# Patient Record
Sex: Male | Born: 1985 | Race: White | Hispanic: No | Marital: Single | Smoking: Current every day smoker
Health system: Southern US, Community
[De-identification: ages and names within clinical notes are randomized; demographics above are authoritative.]

---

## 2008-11-11 ENCOUNTER — Emergency Department: Payer: Self-pay | Admitting: Emergency Medicine

## 2009-05-12 ENCOUNTER — Emergency Department: Payer: Self-pay | Admitting: Emergency Medicine

## 2010-04-04 ENCOUNTER — Emergency Department: Payer: Self-pay | Admitting: Emergency Medicine

## 2011-10-24 ENCOUNTER — Ambulatory Visit: Payer: Self-pay | Admitting: Physician Assistant

## 2013-04-04 IMAGING — CR DG CLAVICLE*L*
1 series · 2 of 2 positions shown · non-contrast
Comparison: none

REASON FOR EXAM: painful
COMMENTS:

PROCEDURE:     MDR - MDR CLAVICLE LEFT  - October 24, 2011  [DATE]
RESULT:     Images of the left clavicle demonstrate no fracture, dislocation
or foreign body.

[Series 1: ap/pa · 0.17mm/px · 2 of 2 slices shown]
[im 1/2]
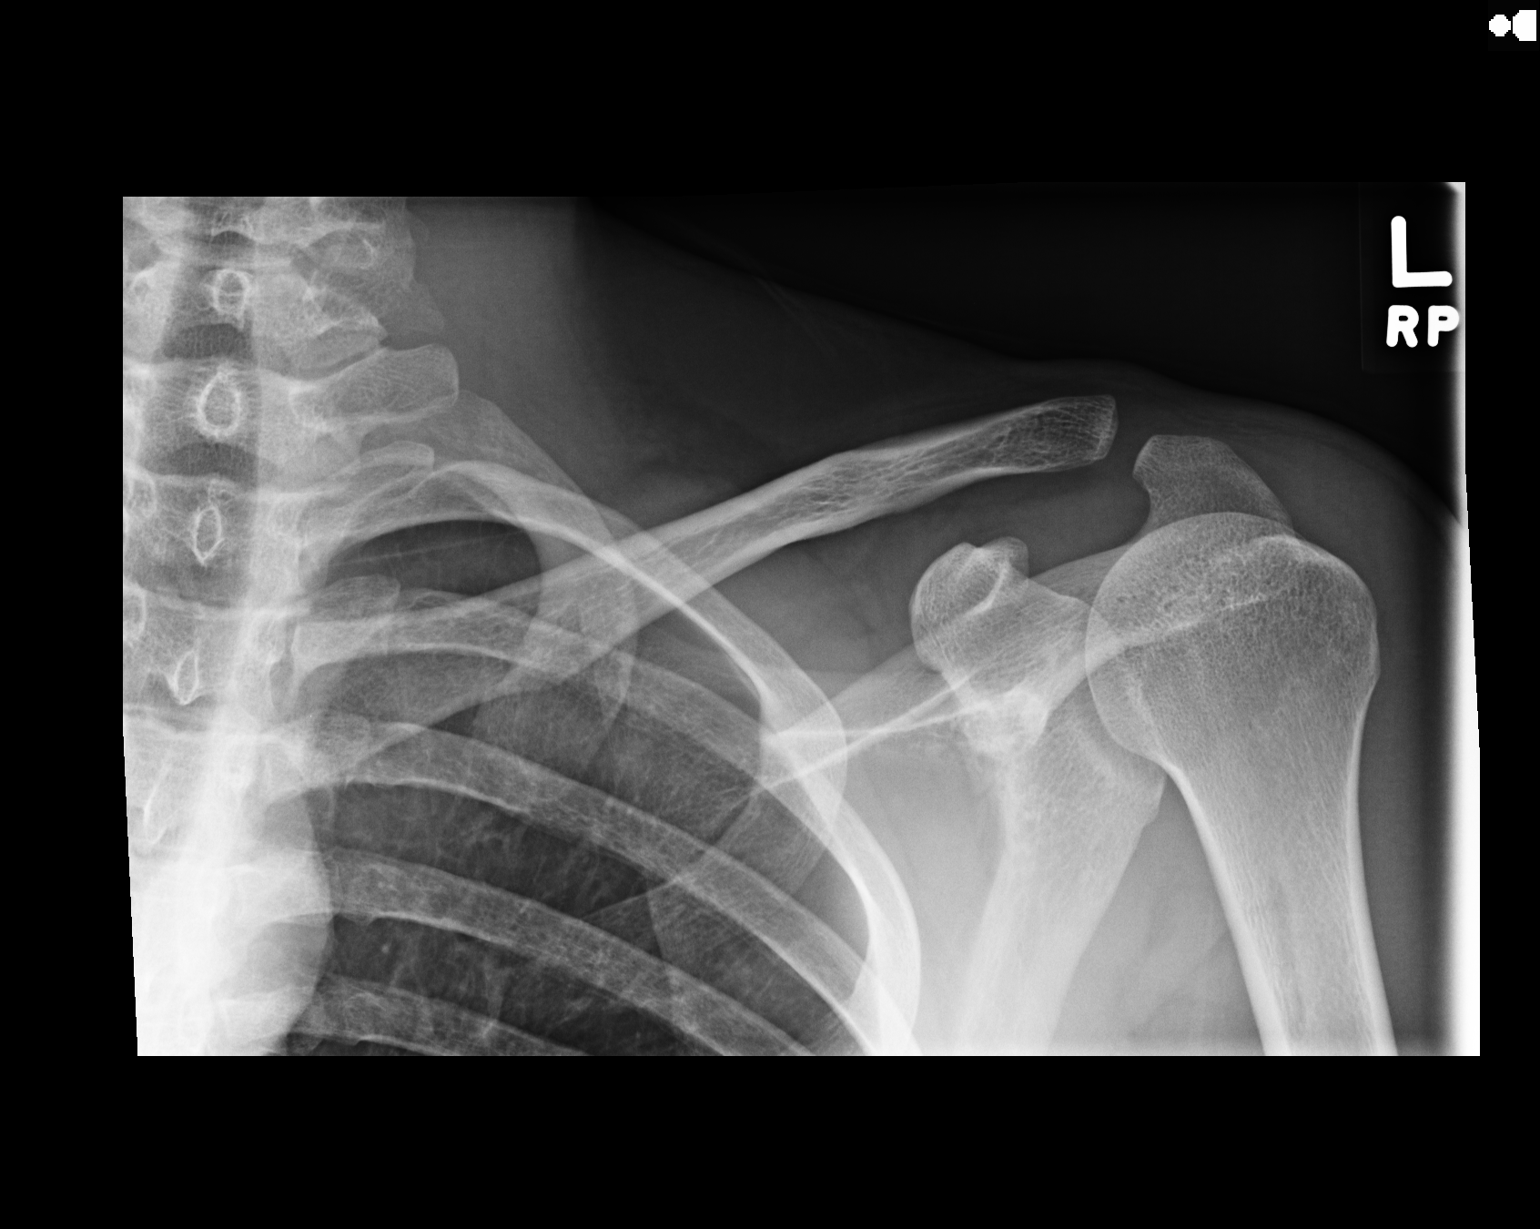
[im 2/2]
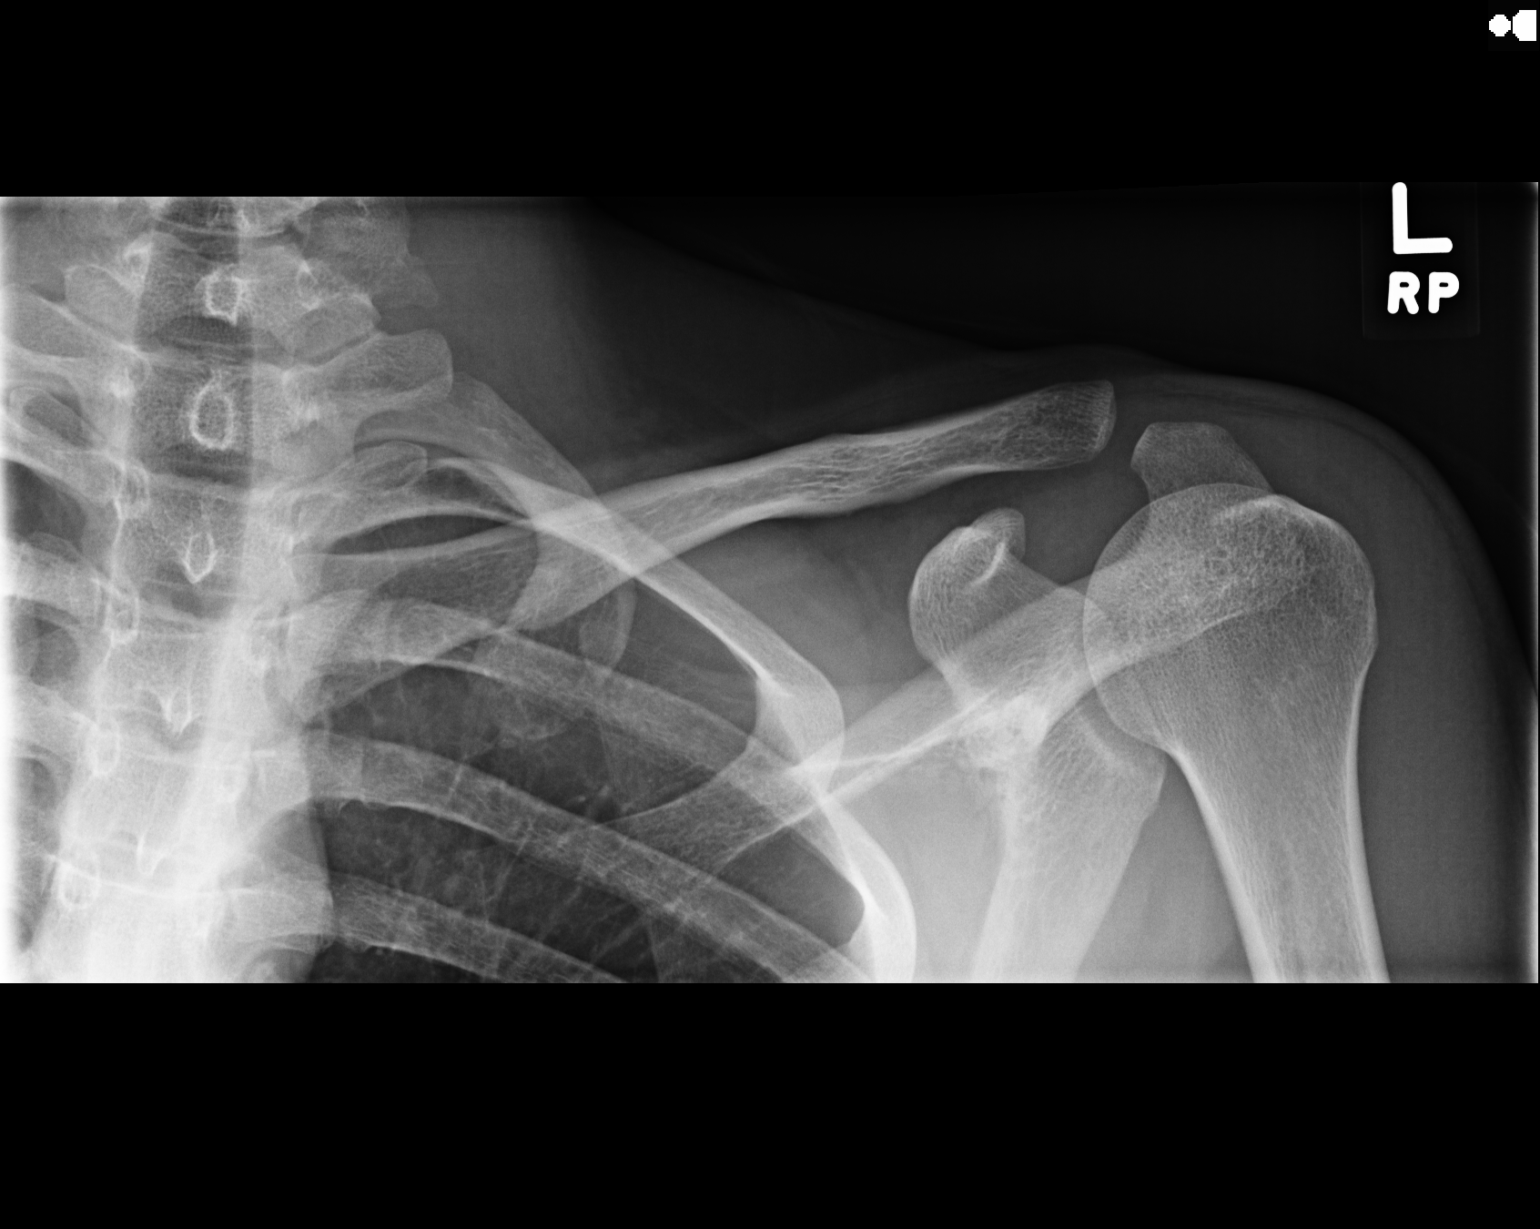

[2 of 2 positions shown; findings below may reference images not displayed]

IMPRESSION: 1. No acute left clavicle bony abnormality evident.

[REDACTED]

## 2014-07-22 ENCOUNTER — Emergency Department
Admit: 2014-07-22 | Disposition: A | Discharge status: Home / Self Care | Payer: Self-pay | Admitting: Emergency Medicine

## 2015-04-05 ENCOUNTER — Encounter: Payer: Self-pay | Admitting: Emergency Medicine

## 2015-04-05 ENCOUNTER — Emergency Department
Admission: EM | Admit: 2015-04-05 | Discharge: 2015-04-05 | Disposition: A | Discharge status: Home / Self Care | Payer: Self-pay | Attending: Emergency Medicine | Admitting: Emergency Medicine

## 2015-04-05 DIAGNOSIS — Y9289 Other specified places as the place of occurrence of the external cause: Secondary | ICD-10-CM | POA: Insufficient documentation

## 2015-04-05 DIAGNOSIS — R000 Tachycardia, unspecified: Secondary | ICD-10-CM | POA: Insufficient documentation

## 2015-04-05 DIAGNOSIS — Y998 Other external cause status: Secondary | ICD-10-CM | POA: Insufficient documentation

## 2015-04-05 DIAGNOSIS — T50901A Poisoning by unspecified drugs, medicaments and biological substances, accidental (unintentional), initial encounter: Secondary | ICD-10-CM

## 2015-04-05 DIAGNOSIS — T407X1A Poisoning by cannabis (derivatives), accidental (unintentional), initial encounter: Secondary | ICD-10-CM | POA: Insufficient documentation

## 2015-04-05 DIAGNOSIS — F17200 Nicotine dependence, unspecified, uncomplicated: Secondary | ICD-10-CM | POA: Insufficient documentation

## 2015-04-05 DIAGNOSIS — Y9389 Activity, other specified: Secondary | ICD-10-CM | POA: Insufficient documentation

## 2015-04-05 DIAGNOSIS — R410 Disorientation, unspecified: Secondary | ICD-10-CM | POA: Insufficient documentation

## 2015-04-05 LAB — SALICYLATE LEVEL: Salicylate Lvl: <4 mg/dL (ref 2.8–30.0)

## 2015-04-05 LAB — COMPREHENSIVE METABOLIC PANEL
ALK PHOS: 59 U/L (ref 38–126)
ALT: 23 U/L (ref 17–63)
AST: 22 U/L (ref 15–41)
Albumin: 4.9 g/dL (ref 3.5–5.0)
Anion gap: 10 (ref 5–15)
BILIRUBIN TOTAL: 0.5 mg/dL (ref 0.3–1.2)
BUN: 19 mg/dL (ref 6–20)
CO2: 25 mmol/L (ref 22–32)
Calcium: 9.5 mg/dL (ref 8.9–10.3)
Chloride: 104 mmol/L (ref 101–111)
Creatinine, Ser: 0.97 mg/dL (ref 0.61–1.24)
GFR calc Af Amer: >60 mL/min (ref >60)
GFR calc non Af Amer: >60 mL/min (ref >60)
GLUCOSE: 156 mg/dL — AB (ref 65–99)
POTASSIUM: 3.2 mmol/L — AB (ref 3.5–5.1)
Sodium: 139 mmol/L (ref 135–145)
TOTAL PROTEIN: 7.7 g/dL (ref 6.5–8.1)

## 2015-04-05 LAB — CBC WITH DIFFERENTIAL/PLATELET
BASOS PCT: 1 %
Basophils Absolute: 0.1 10*3/uL (ref 0–0.1)
EOS ABS: 0.3 10*3/uL (ref 0–0.7)
EOS PCT: 3 %
HCT: 43.8 % (ref 40.0–52.0)
Hemoglobin: 14.4 g/dL (ref 13.0–18.0)
LYMPHS ABS: 4.3 10*3/uL — AB (ref 1.0–3.6)
Lymphocytes Relative: 35 %
MCH: 30.1 pg (ref 26.0–34.0)
MCHC: 32.9 g/dL (ref 32.0–36.0)
MCV: 91.3 fL (ref 80.0–100.0)
MONOS PCT: 8 %
Monocytes Absolute: 0.9 10*3/uL (ref 0.2–1.0)
NEUTROS PCT: 53 %
Neutro Abs: 6.5 10*3/uL (ref 1.4–6.5)
PLATELETS: 269 10*3/uL (ref 150–440)
RBC: 4.79 MIL/uL (ref 4.40–5.90)
RDW: 13.3 % (ref 11.5–14.5)
WBC: 12.1 10*3/uL — AB (ref 3.8–10.6)

## 2015-04-05 LAB — ETHANOL: Alcohol, Ethyl (B): <5 mg/dL (ref <5)

## 2015-04-05 LAB — ACETAMINOPHEN LEVEL: Acetaminophen (Tylenol), Serum: <10 ug/mL — ABNORMAL LOW (ref 10–30)

## 2015-04-05 LAB — TROPONIN I

## 2015-04-05 NOTE — ED Provider Notes (Signed)
[facility] Emergency Department Provider Note  ____________________________________________  Time seen: Approximately 2:36 AM  I have reviewed the triage vital signs and the nursing notes.   HISTORY  Chief Complaint Drug Overdose    HPI Isaac Garza is a 29 y.o. male who reports he smoked a concentrated form of THC, wax her dad. This made him not remember what was going on his. He thought he was having a heart attack his heart was racing like crazy got very confused and called the police. Police brought him in here he was somewhat confused and paranoid with tachycardia. The symptoms resolved rapidly. Patient is not tachycardic anymore is alert oriented patient perfectly good sense his girlfriend is here with him and they want to go home. I told him we have not got any of the tests back yet and could not tell him if there was anything in the material he used except for the TAC. He says he really doesn't care at this point is not given a do that that form of THC again and just wants to go home. He understands that I do not have any reports back.   History reviewed. No pertinent past medical history.  There are no active problems to display for this patient.   History reviewed. No pertinent past surgical history.  No current outpatient prescriptions on file.  Allergies Review of patient's allergies indicates no known allergies.  History reviewed. No pertinent family history.  Social History Social History  Substance Use Topics  . Smoking status: Current Every Day Smoker -- 1.00 packs/day  . Smokeless tobacco: None  . Alcohol Use: No    Review of Systems Constitutional: No fever/chills Eyes: No visual changes. ENT: No sore throat. Cardiovascular: Denies chest pain. Respiratory: Denies shortness of breath. Gastrointestinal: No abdominal pain.  No nausea, no vomiting.  No diarrhea.  No constipation. Genitourinary: Negative for  dysuria. Musculoskeletal: Negative for back pain. Skin: Negative for rash. Neurological: Negative for headaches, focal weakness or numbness.  10-point ROS otherwise negative.  ____________________________________________   PHYSICAL EXAM:  VITAL SIGNS: ED Triage Vitals  Enc Vitals Group     BP 04/05/15 0121 148/85 mmHg     Pulse Rate 04/05/15 0121 114     Resp 04/05/15 0121 18     Temp 04/05/15 0121 98.6 F (37 C)     Temp Source 04/05/15 0121 Oral     SpO2 04/05/15 0121 98 %     Weight 04/05/15 0121 180 lb (81.647 kg)     Height 04/05/15 0121 6' (1.829 m)     Head Cir --      Peak Flow --      Pain Score 04/05/15 0125 0     Pain Loc --      Pain Edu? --      Excl. in GC? --     Constitutional: Initially confused somewhat combative requiring frequent reorientation on discharge he is Alert and oriented. Well appearing and in no acute distress. Eyes: Conjunctivae are normal. PERRL. EOMI. Head: Atraumatic. Nose: No congestion/rhinnorhea. Mouth/Throat: Mucous membranes are moist.  Oropharynx non-erythematous. Neck: No stridor. Neck is supple  Cardiovascular:  initially very tachycardic later Normal rate, regular rhythm. Grossly normal heart sounds.  Good peripheral circulation. Respiratory: Normal respiratory effort.  No retractions. Lungs CTAB. Gastrointestinal: Soft and nontender. No distention. No abdominal bruits. No CVA tenderness. Musculoskeletal: No lower extremity tenderness nor edema.  No joint effusions. Neurologic:   initially confused not making  much sense later Normal speech and language. No gross focal neurologic deficits are appreciated. No gait instability. Skin:  Skin is warm, dry and intact. No rash noted. Psychiatric: Mood and affect are normal. Speech and behavior are normal.  ____________________________________________   LABS (all labs ordered are listed, but only abnormal results are displayed)  Labs Reviewed  COMPREHENSIVE METABOLIC PANEL -  Abnormal; Notable for the following:    Potassium 3.2 (*)    Glucose, Bld 156 (*)    All other components within normal limits  ACETAMINOPHEN LEVEL - Abnormal; Notable for the following:    Acetaminophen (Tylenol), Serum <10 (*)    All other components within normal limits  CBC WITH DIFFERENTIAL/PLATELET - Abnormal; Notable for the following:    WBC 12.1 (*)    Lymphs Abs 4.3 (*)    All other components within normal limits  ETHANOL  SALICYLATE LEVEL  TROPONIN I   ____________________________________________  EKG  EKG read and interpreted by me shows sinus tach at 118 normal axis nonspecific ST-T wave changes or excuse fairly diffusely even the size of the QRS complexes are probably not significant._  RADIOLOGY   ____________________________________________   PROCEDURES   ____________________________________________   INITIAL IMPRESSION / ASSESSMENT AND PLAN / ED COURSE  Pertinent labs & imaging results that were available during my care of the patient were reviewed by me and considered in my medical decision making (see chart for details).   ____________________________________________   FINAL CLINICAL IMPRESSION(S) / ED DIAGNOSES  Final diagnoses:  Accidental drug overdose, initial encounter      Arnaldo Natal, MD 04/05/15 [phone]

## 2015-04-05 NOTE — ED Notes (Signed)
Pt. Girlfriend in room.

## 2015-04-05 NOTE — Discharge Instructions (Signed)
Accidental Overdose °A drug overdose occurs when a chemical substance (drug or medication) is used in amounts large enough to overcome a person. This may result in severe illness or death. This is a type of poisoning. Accidental overdoses of medications or other substances come from a variety of reasons. When this happens accidentally, it is often because the person taking the substance does not know enough about what they have taken. Drugs which commonly cause overdose deaths are alcohol, psychotropic medications (medications which affect the mind), pain medications, illegal drugs (street drugs) such as cocaine and heroin, and multiple drugs taken at the same time. It may result from careless behavior (such as over-indulging at a party). Other causes of overdose may include multiple drug use, a lapse in memory, or drug use after a period of no drug use.  °Sometimes overdosing occurs because a person cannot remember if they have taken their medication.  °A common unintentional overdose in young children involves multi-vitamins containing iron. Iron is a part of the hemoglobin molecule in blood. It is used to transport oxygen to living cells. When taken in small amounts, iron allows the body to restock hemoglobin. In large amounts, it causes problems in the body. If this overdose is not treated, it can lead to death. °Never take medicines that show signs of tampering or do not seem quite right. Never take medicines in the dark or in poor lighting. Read the label and check each dose of medicine before you take it. When adults are poisoned, it happens most often through carelessness or lack of information. Taking medicines in the dark or taking medicine prescribed for someone else to treat the same type of problem is a dangerous practice. °SYMPTOMS  °Symptoms of overdose depend on the medication and amount taken. They can vary from over-activity with stimulant over-dosage, to sleepiness from depressants such as  alcohol, narcotics and tranquilizers. Confusion, dizziness, nausea and vomiting may be present. If problems are severe enough coma and death may result. °DIAGNOSIS  °Diagnosis and management are generally straightforward if the drug is known. Otherwise it is more difficult. At times, certain symptoms and signs exhibited by the patient, or blood tests, can reveal the drug in question.  °TREATMENT  °In an emergency department, most patients can be treated with supportive measures. Antidotes may be available if there has been an overdose of opioids or benzodiazepines. A rapid improvement will often occur if this is the cause of overdose. °At home or away from medical care: °· There may be no immediate problems or warning signs in children. °· Not everything works well in all cases of poisoning. °· Take immediate action. Poisons may act quickly. °· If you think someone has swallowed medicine or a household product, and the person is unconscious, having seizures (convulsions), or is not breathing, immediately call for an ambulance. °IF a person is conscious and appears to be doing OK but has swallowed a poison: °· Do not wait to see what effect the poison will have. Immediately call a poison control center (listed in the white pages of your telephone book under "Poison Control" or inside the front cover with other emergency numbers). Some poison control centers have TTY capability for the deaf. Check with your local center if you or someone in your family requires this service. °· Keep the container so you can read the label on the product for ingredients. °· Describe what, when, and how much was taken and the age and condition of the person poisoned.   Inform them if the person is vomiting, choking, drowsy, shows a change in color or temperature of skin, is conscious or unconscious, or is convulsing.  Do not cause vomiting unless instructed by medical personnel. Do not induce vomiting or force liquids into a person who  is convulsing, unconscious, or very drowsy. Stay calm and in control.   Activated charcoal also is sometimes used in certain types of poisoning and you may wish to add a supply to your emergency medicines. It is available without a prescription. Call a poison control center before using this medication. PREVENTION  Thousands of children die every year from unintentional poisoning. This may be from household chemicals, poisoning from carbon monoxide in a car, taking their parent's medications, or simply taking a few iron pills or vitamins with iron. Poisoning comes from unexpected sources.  Store medicines out of the sight and reach of children, preferably in a locked cabinet. Do not keep medications in a food cabinet. Always store your medicines in a secure place. Get rid of expired medications.  If you have children living with you or have them as occasional guests, you should have child-resistant caps on your medicine containers. Keep everything out of reach. Child proof your home.  If you are called to the telephone or to answer the door while you are taking a medicine, take the container with you or put the medicine out of the reach of small children.  Do not take your medication in front of children. Do not tell your child how good a medication is and how good it is for them. They may get the idea it is more of a treat.  If you are an adult and have accidentally taken an overdose, you need to consider how this happened and what can be done to prevent it from happening again. If this was from a street drug or alcohol, determine if there is a problem that needs addressing. If you are not sure a problems exists, it is easy to talk to a professional and ask them if they think you have a problem. It is better to handle this problem in this way before it happens again and has a much worse consequence.   This information is not intended to replace advice given to you by your health care provider. Make  sure you discuss any questions you have with your health care provider.   Document Released: 06/05/2004 Document Revised: 04/12/2014 Document Reviewed: 09/09/2014 [employer] Interactive Patient Education [employer]   I really wish you would stay longer to let us get the lab work back but since you do not want to and you will appear to be stable (although I can't be sure) I will let you go. Please do not use that concentrated form of THC again, it does not appear to agree with you. Please return for any further problems.

## 2015-04-05 NOTE — ED Notes (Signed)
Pt. States he smoked wax/dep(concentrated form of THC).  Pt. Does not have much of a memory of what else happened tonight.

## 2015-04-05 NOTE — ED Notes (Signed)
Pt. Walked into room with EMS.  Pt. Does not have clear memory on what happened.  Pt. States smoking concentrated form of THC, called wax.

## 2020-06-18 ENCOUNTER — Emergency Department
Admission: EM | Admit: 2020-06-18 | Discharge: 2020-06-18 | Disposition: A | Discharge status: Home / Self Care | Payer: Self-pay | Attending: Emergency Medicine | Admitting: Emergency Medicine

## 2020-06-18 ENCOUNTER — Other Ambulatory Visit: Payer: Self-pay

## 2020-06-18 DIAGNOSIS — F5102 Adjustment insomnia: Secondary | ICD-10-CM | POA: Diagnosis present

## 2020-06-18 DIAGNOSIS — R45851 Suicidal ideations: Secondary | ICD-10-CM | POA: Insufficient documentation

## 2020-06-18 DIAGNOSIS — F430 Acute stress reaction: Secondary | ICD-10-CM

## 2020-06-18 DIAGNOSIS — F17200 Nicotine dependence, unspecified, uncomplicated: Secondary | ICD-10-CM | POA: Insufficient documentation

## 2020-06-18 DIAGNOSIS — G4700 Insomnia, unspecified: Secondary | ICD-10-CM

## 2020-06-18 DIAGNOSIS — F411 Generalized anxiety disorder: Secondary | ICD-10-CM | POA: Diagnosis present

## 2020-06-18 DIAGNOSIS — F419 Anxiety disorder, unspecified: Secondary | ICD-10-CM | POA: Insufficient documentation

## 2020-06-18 LAB — COMPREHENSIVE METABOLIC PANEL
ALT: 18 U/L (ref 0–44)
AST: 18 U/L (ref 15–41)
Albumin: 4.3 g/dL (ref 3.5–5.0)
Alkaline Phosphatase: 51 U/L (ref 38–126)
Anion gap: 8 (ref 5–15)
BUN: 8 mg/dL (ref 6–20)
CO2: 27 mmol/L (ref 22–32)
Calcium: 9.2 mg/dL (ref 8.9–10.3)
Chloride: 104 mmol/L (ref 98–111)
Creatinine, Ser: 1.61 mg/dL — ABNORMAL HIGH (ref 0.61–1.24)
GFR, Estimated: 58 mL/min — ABNORMAL LOW (ref >60)
Glucose, Bld: 84 mg/dL (ref 70–99)
Potassium: 3.8 mmol/L (ref 3.5–5.1)
Sodium: 139 mmol/L (ref 135–145)
Total Bilirubin: 0.5 mg/dL (ref 0.3–1.2)
Total Protein: 7.1 g/dL (ref 6.5–8.1)

## 2020-06-18 LAB — URINE DRUG SCREEN, QUALITATIVE (ARMC ONLY)
Amphetamines, Ur Screen: NOT DETECTED
Barbiturates, Ur Screen: NOT DETECTED
Benzodiazepine, Ur Scrn: POSITIVE — AB
Cannabinoid 50 Ng, Ur ~~LOC~~: POSITIVE — AB
Cocaine Metabolite,Ur ~~LOC~~: NOT DETECTED
MDMA (Ecstasy)Ur Screen: NOT DETECTED
Methadone Scn, Ur: NOT DETECTED
Opiate, Ur Screen: NOT DETECTED
Phencyclidine (PCP) Ur S: NOT DETECTED
Tricyclic, Ur Screen: NOT DETECTED

## 2020-06-18 LAB — CBC
HCT: 40.2 % (ref 39.0–52.0)
Hemoglobin: 13.4 g/dL (ref 13.0–17.0)
MCH: 30.6 pg (ref 26.0–34.0)
MCHC: 33.3 g/dL (ref 30.0–36.0)
MCV: 91.8 fL (ref 80.0–100.0)
Platelets: 290 10*3/uL (ref 150–400)
RBC: 4.38 MIL/uL (ref 4.22–5.81)
RDW: 13.1 % (ref 11.5–15.5)
WBC: 13.1 10*3/uL — ABNORMAL HIGH (ref 4.0–10.5)
nRBC: 0 % (ref 0.0–0.2)

## 2020-06-18 LAB — ACETAMINOPHEN LEVEL: Acetaminophen (Tylenol), Serum: <10 ug/mL — ABNORMAL LOW (ref 10–30)

## 2020-06-18 LAB — ETHANOL: Alcohol, Ethyl (B): <10 mg/dL (ref <10)

## 2020-06-18 LAB — SALICYLATE LEVEL: Salicylate Lvl: <7 mg/dL — ABNORMAL LOW (ref 7.0–30.0)

## 2020-06-18 MED ORDER — TRAZODONE HCL 50 MG PO TABS: 50.0000 mg | ORAL_TABLET | Freq: Every day | ORAL | 0 refills | Status: AC
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
## 2020-06-18 NOTE — ED Notes (Signed)
Pts belongings:  [employer] Jeans Orange sweatshirt Programme researcher, broadcasting/film/video White socks Black hat White long pants [employer] under wear

## 2020-06-18 NOTE — ED Notes (Signed)
Pt given 2 belonging bags with clothing and shoes. All discharge paperwork reviewed, all questions answered. Pt verbalized understanding to return to ED if thoughts of SI. Denies any other questions/concerns at time of discharge. Pt ambulatory out of ED with steady gait.

## 2020-06-18 NOTE — ED Provider Notes (Signed)
[facility] Emergency Department Provider Note  ____________________________________________   Event Date/Time   First MD Initiated Contact with Patient 06/18/20 1937     (approximate)  I have reviewed the triage vital signs and the nursing notes.   HISTORY  Chief Complaint Suicidal    HPI Isaac Garza is a 35 y.o. male who comes in with suicidal thoughts for the past 4 weeks.  Patient states that he has not been able to sleep for the past 5 days.  Denies any other prior psychiatric history or history of any other illnesses.  He states that he cannot sleep at nighttime which is severe, constant, nothing makes it better, nothing makes it worse.  He states that because he cannot sleep he feels that he feels overwhelmed and upset about it. He states that he [place] today he felt like he was wandering off into the room but does not only remember exactly what was happening.  Was not hit by a car contrary to triage note.  Denies any alcohol or drugs but does report using cigarettes.  Denies any other medical concerns          History reviewed. No pertinent past medical history.  There are no problems to display for this patient.   History reviewed. No pertinent surgical history.  Prior to Admission medications   Not on File    Allergies Patient has no known allergies.  No family history on file.  Social History Social History   Tobacco Use   Smoking status: Current Every Day Smoker    Packs/day: 1.00   Smokeless tobacco: Never Used  Substance Use Topics   Alcohol use: No   Drug use: Yes    Types: Marijuana      Review of Systems Constitutional: No fever/chills Eyes: No visual changes. ENT: No sore throat. Cardiovascular: Denies chest pain. Respiratory: Denies shortness of breath. Gastrointestinal: No abdominal pain.  No nausea, no vomiting.  No diarrhea.  No constipation. Genitourinary: Negative for  dysuria. Musculoskeletal: Negative for back pain. Skin: Negative for rash. Neurological: Negative for headaches, focal weakness or numbness. Psych: Positive depression positive SI positive not sleeping All other ROS negative ____________________________________________   PHYSICAL EXAM:  VITAL SIGNS: ED Triage Vitals  Enc Vitals Group     BP 06/18/20 1928 130/87     Pulse Rate 06/18/20 1928 73     Resp 06/18/20 1928 20     Temp 06/18/20 1928 98 F (36.7 C)     Temp src --      SpO2 06/18/20 1928 98 %     Weight 06/18/20 1925 165 lb (74.8 kg)     Height 06/18/20 1925 5\' 10"  (1.778 m)     Head Circumference --      Peak Flow --      Pain Score 06/18/20 1925 0     Pain Loc --      Pain Edu? --      Excl. in GC? --     Constitutional: Alert and oriented. Well appearing and in no acute distress. Eyes: Conjunctivae are normal. No swelling around eyes Head: Atraumatic. Nose: No congestion/rhinnorhea. Mouth/Throat: Mucous membranes are moist.   Neck: No stridor. Trachea Midline. FROM Cardiovascular: Normal rate, no swelling noted Respiratory: No increased wob, no stridor Gastrointestinal: Soft and nontender. No distention. No abdominal bruits.  Musculoskeletal: No lower extremity tenderness nor edema.  No joint effusions. Neurologic:  Normal speech and language. No gross  focal neurologic deficits are appreciated.  Skin:  Skin is warm, dry and intact. No rash noted. Psychiatric: Positive SI, no HI GU: Deferred   ____________________________________________   LABS (all labs ordered are listed, but only abnormal results are displayed)  Labs Reviewed  COMPREHENSIVE METABOLIC PANEL - Abnormal; Notable for the following components:      Result Value   Creatinine, Ser 1.61 (*)    GFR, Estimated 58 (*)    All other components within normal limits  CBC - Abnormal; Notable for the following components:   WBC 13.1 (*)    All other components within normal limits  ETHANOL   SALICYLATE LEVEL  ACETAMINOPHEN LEVEL  URINE DRUG SCREEN, QUALITATIVE ([facility] ONLY)   ____________________________________________    INITIAL IMPRESSION / ASSESSMENT AND PLAN / ED COURSE  Isaac Garza was evaluated in Emergency Department on 06/18/2020 for the symptoms described in the history of present illness. He was evaluated in the context of the global COVID-19 pandemic, which necessitated consideration that the patient might be at risk for infection with the SARS-CoV-2 virus that causes COVID-19. Institutional protocols and algorithms that pertain to the evaluation of patients at risk for COVID-19 are in a state of rapid change based on information released by regulatory bodies including the CDC and federal and state organizations. These policies and algorithms were followed during the patient's care in the ED.    Pt is without any acute medical complaints. No exam findings to suggest medical cause of current presentation. Will order psychiatric screening labs and discuss further w/ psychiatric service.  D/d includes but is not limited to psychiatric disease, behavioral/personality disorder, inadequate socioeconomic support, medical.  Based on HPI, exam, unremarkable labs, no concern for acute medical problem at this time. No rigidity, clonus, hyperthermia, focal neurologic deficit, diaphoresis, tachycardia, meningismus, ataxia, gait abnormality or other finding to suggest this visit represents a non-psychiatric problem. Screening labs reviewed.    Given this, pt medically cleared, to be dispositioned per Psych.    The patient has been placed in psychiatric observation due to the need to provide a safe environment for the patient while obtaining psychiatric consultation and evaluation, as well as ongoing medical and medication management to treat the patient's condition.  The patient has not been placed under full IVC at this time.   Patient seen by the psychiatric team and cleared  for discharge home.  See their note for more information.  Patient's UA was positive for benzos and marijuana.  Encourage cessation.  Also patient states that he took the benzos just 2 pills to try to help him with sleep but he states that it did not help.  Explained to patient that he cannot use that with the medication I am prescribing because it can make things oversedated him lead to death.  He expressed understanding.  At this time he denies any SI and he feels safe going home.  Psychiatry team reached out to his partner as well and she is feet felt safe with him coming  I did alert patient about his slightly elevated kidney function how he is from plenty of water and have this rechecked with his primary care doctor  I discussed the provisional nature of ED diagnosis, the treatment so far, the ongoing plan of care, follow up appointments and return precautions with the patient and any family or support people present. They expressed understanding and agreed with the plan, discharged home.    ____________________________________________   FINAL CLINICAL IMPRESSION(S) /  ED DIAGNOSES   Final diagnoses:  Insomnia, unspecified type      MEDICATIONS GIVEN DURING THIS VISIT:  Medications - No data to display   ED Discharge Orders          Ordered    traZODone (DESYREL) 50 MG tablet  Daily at bedtime        06/18/20 2201             Note:  This document was prepared using Dragon voice recognition software and may include unintentional dictation errors.   Concha Se, MD 06/18/20 [phone]
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
## 2020-06-18 NOTE — Discharge Instructions (Signed)
Take the medication to help with sleep.  Return to ER if you have thoughts of hurting yourself.  Your kidney function was also elevated patient drink plenty of fluid and have this rechecked with a primary care doctor.

## 2020-06-18 NOTE — ED Notes (Signed)
Pt attempting for urine sample.  

## 2022-07-15 ENCOUNTER — Other Ambulatory Visit: Payer: Self-pay

## 2022-07-15 ENCOUNTER — Emergency Department
Admission: EM | Admit: 2022-07-15 | Discharge: 2022-07-15 | Disposition: A | Discharge status: Home / Self Care | Payer: Self-pay | Attending: Emergency Medicine | Admitting: Emergency Medicine

## 2022-07-15 ENCOUNTER — Emergency Department: Payer: Self-pay

## 2022-07-15 DIAGNOSIS — X503XXA Overexertion from repetitive movements, initial encounter: Secondary | ICD-10-CM | POA: Insufficient documentation

## 2022-07-15 DIAGNOSIS — M546 Pain in thoracic spine: Secondary | ICD-10-CM | POA: Insufficient documentation

## 2022-07-15 DIAGNOSIS — M549 Dorsalgia, unspecified: Secondary | ICD-10-CM

## 2022-07-15 DIAGNOSIS — S143XXA Injury of brachial plexus, initial encounter: Secondary | ICD-10-CM | POA: Insufficient documentation

## 2022-07-15 MED ORDER — PREDNISONE 10 MG (21) PO TBPK: ORAL_TABLET | ORAL | 0 refills | Status: AC

## 2022-07-15 MED ORDER — BACLOFEN 10 MG PO TABS: 10.0000 mg | ORAL_TABLET | Freq: Three times a day (TID) | ORAL | 0 refills | Status: AC

## 2022-07-15 NOTE — Discharge Instructions (Signed)
Follow-up with Dr. Marcell Barlow, call for an appointment Take medications as prescribed.  Only take the baclofen when not driving. Apply ice to the upper back
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
## 2022-07-15 NOTE — ED Triage Notes (Signed)
C/O upper back pain x 1.5 years.  States pain worse with the job he is doing.  States pain will radiate down both arms.  Denies current pain, but states pain worsens throughout the day after working.
